# Patient Record
Sex: Female | Born: 2000 | Race: White | Hispanic: No | Marital: Single | Smoking: Former smoker
Health system: Southern US, Community
[De-identification: ages and names within clinical notes are randomized; demographics above are authoritative.]

## PROBLEM LIST (undated history)

## (undated) DIAGNOSIS — R112 Nausea with vomiting, unspecified: Secondary | ICD-10-CM

## (undated) DIAGNOSIS — R519 Headache, unspecified: Secondary | ICD-10-CM

## (undated) DIAGNOSIS — Q433 Congenital malformations of intestinal fixation: Secondary | ICD-10-CM

## (undated) DIAGNOSIS — R51 Headache: Secondary | ICD-10-CM

## (undated) DIAGNOSIS — B019 Varicella without complication: Secondary | ICD-10-CM

## (undated) DIAGNOSIS — Z9889 Other specified postprocedural states: Secondary | ICD-10-CM

## (undated) DIAGNOSIS — T7840XA Allergy, unspecified, initial encounter: Secondary | ICD-10-CM

## (undated) DIAGNOSIS — H539 Unspecified visual disturbance: Secondary | ICD-10-CM

## (undated) DIAGNOSIS — E669 Obesity, unspecified: Secondary | ICD-10-CM

## (undated) HISTORY — PX: ADENOIDECTOMY: SUR15

## (undated) HISTORY — PX: TONSILLECTOMY: SUR1361

## (undated) HISTORY — PX: EYE SURGERY: SHX253

---
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
## 2019-01-07 ENCOUNTER — Telehealth: Payer: Self-pay | Admitting: *Deleted

## 2019-01-07 NOTE — Telephone Encounter (Signed)
Left a message for Annette Kennedy to call. Need to inform her that her chest x-ray was o.k. per Dr. Neldon Mc.

## 2019-01-14 NOTE — Telephone Encounter (Addendum)
Mother called back- I informed her of the chest x ray results.

## 2019-01-14 NOTE — Telephone Encounter (Signed)
Left another message.

## 2019-01-27 ENCOUNTER — Encounter: Payer: Self-pay | Admitting: *Deleted

## 2019-03-03 ENCOUNTER — Other Ambulatory Visit: Payer: Self-pay

## 2019-03-03 ENCOUNTER — Encounter: Payer: Self-pay | Admitting: Allergy and Immunology

## 2019-03-03 ENCOUNTER — Ambulatory Visit (INDEPENDENT_AMBULATORY_CARE_PROVIDER_SITE_OTHER): Payer: Medicaid Other | Admitting: Allergy and Immunology

## 2019-03-03 VITALS — BP 104/82 | HR 92 | Temp 97.1°F | Resp 20

## 2019-03-03 DIAGNOSIS — J3089 Other allergic rhinitis: Secondary | ICD-10-CM

## 2019-03-03 DIAGNOSIS — D72820 Lymphocytosis (symptomatic): Secondary | ICD-10-CM | POA: Diagnosis not present

## 2019-03-03 DIAGNOSIS — R0789 Other chest pain: Secondary | ICD-10-CM | POA: Diagnosis not present

## 2019-03-03 DIAGNOSIS — L500 Allergic urticaria: Secondary | ICD-10-CM

## 2019-03-03 NOTE — Patient Instructions (Addendum)
  1.  Every day use the following medications:   A.  Cetirizine 10 mg - 1 tablet 1 time per day  B.  Famotidine 20 mg - 1 tablet 1 time per day  C.  Flonase-1 spray each nostril once a day  2.  Can add Benadryl if needed  3.  Obtain CBC with differential for lymphocytosis  4.  Obtain flu vaccine and Covid vaccine  5.  Return to clinic in 6 months or earlier if problem

## 2019-03-03 NOTE — Progress Notes (Signed)
[place]   Follow-up Note  Referring Provider: Leodis Sias Primary Provider: Myles Lipps Date of Office Visit: 03/03/2019  Subjective:   Annette Kennedy (DOB: April 20, 2000) is a 18 y.o. female who returns to [facility] on 03[facility]/2019 in re-evaluation of the following:  HPI: Annette Kennedy returns to this clinic in evaluation of her urticaria and allergic rhinitis and lymphocytosis and sternal chest discomfort.  Her last visit to this clinic was 30 December 2018.  Overall her urticarial issue has improved dramatically.  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  She has not been have any issues with her chest discomfort and pain.  She has done very well with her nose while you intermittently using a nasal steroid.  She did not obtain a follow-up CBC with differential in evaluation of her lymphocytosis.  Allergies as of 03/03/2019      Reactions   Penicillins Swelling   Has patient had a PCN reaction causing immediate rash, facial/tongue/throat swelling, SOB or lightheadedness with hypotension: Yes Has patient had a PCN reaction causing severe rash involving mucus membranes or skin necrosis: No Has patient had a PCN reaction that required hospitalization Yes Has patient had a PCN reaction occurring within the last 10 years: No If all of the above answers are "NO", then may proceed with Cephalosporin use.      Medication List      BENADRYL PO Take by mouth as needed.   cetirizine 10 MG tablet Commonly known as: ZYRTEC Take 1 tablet (10 mg total) by mouth 2 (two) times daily.   famotidine 20 MG tablet Commonly known as: PEPCID Take 1 tablet (20 mg total) by mouth 2 (two) times daily.   fluticasone 50 MCG/ACT nasal spray Commonly known as: FLONASE Use one spray in each nostril once daily as  directed.   montelukast 10 MG tablet Commonly known as: SINGULAIR Take 1 tablet (10 mg total) by mouth at bedtime.   PRESCRIPTION MEDICATION daily. Antidepressant- patient unsure of name       Past Medical History:  Diagnosis Date  . Allergy   . Headache   . Intestinal malrotation   . Obesity   . PONV (postoperative nausea and vomiting)   . Varicella    as a child  . Vision abnormalities    wears glasses    Past Surgical History:  Procedure Laterality Date  . ADENOIDECTOMY    . EYE SURGERY    . LAPAROSCOPIC APPENDECTOMY N/A 07/24/2016   Procedure: APPENDECTOMY LAPAROSCOPIC;  Surgeon: Stanford Scotland, MD;  Location: [facility];  Service: Pediatrics;  Laterality: N/A;  . LAPAROSCOPIC LYSIS OF ADHESIONS N/A 07/24/2016   Procedure: CORRECTION OF MALROTATION BY LYSIS OF DUODENAL BANDS, AND REDUCTION OF MID GUT VOLVULUS, LADD PROCEDURE;  Surgeon: Stanford Scotland, MD;  Location: [facility];  Service: Pediatrics;  Laterality: N/A;  . TONSILLECTOMY      Review of systems negative except as noted in HPI / PMHx or noted below:  Review of Systems  Constitutional: Negative.   HENT: Negative.   Eyes: Negative.   Respiratory: Negative.   Cardiovascular: Negative.   Gastrointestinal: Negative.   Genitourinary: Negative.   Musculoskeletal: Negative.   Skin: Negative.   Neurological: Negative.   Endo/Heme/Allergies: Negative.   Psychiatric/Behavioral: Negative.  Objective:   Vitals:   03/03/19 1542  BP: 104/82  Pulse: 92  Resp: 20  Temp: (!) 97.1 F (36.2 C)  SpO2: 98%          Physical Exam Constitutional:      Appearance: She is not diaphoretic.  HENT:     Head: Normocephalic.     Right Ear: Tympanic membrane, ear canal and external ear normal.     Left Ear: Tympanic membrane, ear canal and external ear normal.     Nose: Nose normal. No mucosal edema or rhinorrhea.     Mouth/Throat:     Pharynx: Uvula midline. No oropharyngeal exudate.  Eyes:      Conjunctiva/sclera: Conjunctivae normal.  Neck:     Thyroid: No thyromegaly.     Trachea: Trachea normal. No tracheal tenderness or tracheal deviation.  Cardiovascular:     Rate and Rhythm: Normal rate and regular rhythm.     Heart sounds: Normal heart sounds, S1 normal and S2 normal. No murmur.  Pulmonary:     Effort: No respiratory distress.     Breath sounds: Normal breath sounds. No stridor. No wheezing or rales.  Lymphadenopathy:     Head:     Right side of head: No tonsillar adenopathy.     Left side of head: No tonsillar adenopathy.     Cervical: No cervical adenopathy.  Skin:    Findings: No erythema or rash.     Nails: There is no clubbing.  Neurological:     Mental Status: She is alert.     Diagnostics:   Results of a chest x-ray obtained 04 January 2019 identified no significant abnormality.  Assessment and Plan:   1. Allergic urticaria   2. Perennial allergic rhinitis   3. Other chest pain   4. Lymphocytosis     1.  Every day use the following medications:   A.  Cetirizine 10 mg - 1 tablet 1 time per day  B.  Famotidine 20 mg - 1 tablet 1 time per day  C.  Flonase-1 spray each nostril once a day  2.  Can add Benadryl if needed  3.  Obtain CBC with differential for lymphocytosis  4.  Obtain flu vaccine and Covid vaccine  5.  Return to clinic in 6 months or earlier if problem  Annette Kennedy is doing better at this point.  What ever the process was that gave rise to her overactive immune system appears to be somewhat waning.  It should be noted that her overactive immune system developed after she received a contraceptive implant in her arm.  The time course between receiving this implant and developing her reaction was about 1 month and it is quite possible that that was the trigger giving rise to this issue.  In any case she is a lot better and I have asked her to remain on cetirizine and famotidine to address her overactive immune system and what appears to be a  reflux induced chest pain issue and she can continue on Flonase for her upper airway issue.  I would like for her to obtain a CBC with differential in evaluation of her pre-existing lymphocytosis.  I will see her back in this clinic in 6 months or earlier if there is a problem.  Annette Schimke, MD Allergy / Immunology [facility]

## 2019-03-04 ENCOUNTER — Encounter: Payer: Self-pay | Admitting: Allergy and Immunology

## 2019-09-01 ENCOUNTER — Ambulatory Visit: Payer: Medicaid Other | Admitting: Allergy and Immunology
